# Patient Record
Sex: Female | Born: 1965 | Race: White | Hispanic: No | Marital: Married | Smoking: Never smoker
Health system: Southern US, Community
[De-identification: ages and names within clinical notes are randomized; demographics above are authoritative.]

## PROBLEM LIST (undated history)

## (undated) DIAGNOSIS — D649 Anemia, unspecified: Secondary | ICD-10-CM

## (undated) DIAGNOSIS — F988 Other specified behavioral and emotional disorders with onset usually occurring in childhood and adolescence: Secondary | ICD-10-CM

## (undated) HISTORY — DX: Anemia, unspecified: D64.9

## (undated) HISTORY — PX: EYE SURGERY: SHX253

## (undated) HISTORY — DX: Other specified behavioral and emotional disorders with onset usually occurring in childhood and adolescence: F98.8

## (undated) HISTORY — PX: WISDOM TOOTH EXTRACTION: SHX21

---

## 2000-09-10 ENCOUNTER — Encounter (INDEPENDENT_AMBULATORY_CARE_PROVIDER_SITE_OTHER): Payer: Self-pay

## 2000-09-10 ENCOUNTER — Inpatient Hospital Stay (HOSPITAL_COMMUNITY): Admission: AD | Admit: 2000-09-10 | Discharge: 2000-09-12 | Payer: Self-pay | Admitting: Obstetrics and Gynecology

## 2000-09-14 ENCOUNTER — Encounter: Admission: RE | Admit: 2000-09-14 | Discharge: 2000-12-13 | Payer: Self-pay | Admitting: Obstetrics and Gynecology

## 2010-01-19 ENCOUNTER — Emergency Department (HOSPITAL_COMMUNITY): Admission: EM | Admit: 2010-01-19 | Discharge: 2010-01-19 | Payer: Self-pay | Admitting: Emergency Medicine

## 2015-08-08 ENCOUNTER — Ambulatory Visit (INDEPENDENT_AMBULATORY_CARE_PROVIDER_SITE_OTHER): Payer: BLUE CROSS/BLUE SHIELD | Admitting: Sports Medicine

## 2015-08-08 ENCOUNTER — Encounter: Payer: Self-pay | Admitting: Sports Medicine

## 2015-08-08 ENCOUNTER — Ambulatory Visit
Admission: RE | Admit: 2015-08-08 | Discharge: 2015-08-08 | Disposition: A | Discharge status: Home / Self Care | Payer: BLUE CROSS/BLUE SHIELD | Source: Ambulatory Visit | Attending: Sports Medicine | Admitting: Sports Medicine

## 2015-08-08 VITALS — BP 127/79 | HR 76 | Ht 64.0 in | Wt 110.0 lb

## 2015-08-08 DIAGNOSIS — M542 Cervicalgia: Secondary | ICD-10-CM

## 2015-08-08 DIAGNOSIS — M4302 Spondylolysis, cervical region: Secondary | ICD-10-CM

## 2015-08-08 MED ORDER — CYCLOBENZAPRINE HCL 5 MG PO TABS: ORAL_TABLET | ORAL | Status: DC

## 2015-08-08 NOTE — Patient Instructions (Signed)
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

## 2015-08-08 NOTE — Assessment & Plan Note (Signed)
We will try some home exercises and use of Flexeril at night

## 2015-08-08 NOTE — Assessment & Plan Note (Signed)
I suspect she gets nerve impingement  This is probably triggering the chronic trapezius spasm  We will try Flexeril but if she does not get a response would give a trial of gabapentin  Posture and isometric exercises for the neck

## 2015-08-08 NOTE — Progress Notes (Signed)
Patient ID: Katie RangeMichelle W Chandler, female   DOB: 1966/08/05, 49 y.o.   MRN: [id number]  Tennis athlete who plays 8 - 10 hours weekly She has had right-sided trapezius and neck pain This has been persistent at least 4 months No specific injury She also lifts weights Most lifts do not cause pain Bending her neck to the right rotating the neck causes pain Hitting a two-handed back and causes pain  She has tried massage with no relief Advil gives minimal relief  Past history- denies serious medical illnesses and has had very few visits to the physician  Social history - Works in Print production plannerreal estate sales. She will get more pain in the neck with too much computer work  Review of systems No numbness or tingling in the hand/ no weakness of grip or in the forearm/ no cough or sneeze pain/ no go back pain  Physical exam Physically fit female in no acute distress BP 127/79 mmHg  Pulse 76  Ht 5\' 4"  (1.626 m)  Wt 110 lb (49.896 kg)  BMI 18.87 kg/m2  LMP 07/29/2015  For her back extension and flexion Rotation to the right is limited by 10 but is normal Lateral band causes pain on the right but is normal Spurling's test is normal bilaterally  Upper extremity strength is normal Neurologic testing C5-T1 is normal Reflexes are normal  X-ray of cervical spine Foraminal narrowing and some spurring mostly at C5-6 on the right and at C5-6 and C6-7 the left Disc spaces are largely preserved although C5-C6 is somewhat narrow

## 2016-04-21 DIAGNOSIS — Z1151 Encounter for screening for human papillomavirus (HPV): Secondary | ICD-10-CM | POA: Diagnosis not present

## 2016-04-21 DIAGNOSIS — Z681 Body mass index (BMI) 19 or less, adult: Secondary | ICD-10-CM | POA: Diagnosis not present

## 2016-04-21 DIAGNOSIS — Z01419 Encounter for gynecological examination (general) (routine) without abnormal findings: Secondary | ICD-10-CM | POA: Diagnosis not present

## 2016-06-29 DIAGNOSIS — Z0000 Encounter for general adult medical examination without abnormal findings: Secondary | ICD-10-CM | POA: Diagnosis not present

## 2016-06-29 DIAGNOSIS — N390 Urinary tract infection, site not specified: Secondary | ICD-10-CM | POA: Diagnosis not present

## 2016-06-29 DIAGNOSIS — R8299 Other abnormal findings in urine: Secondary | ICD-10-CM | POA: Diagnosis not present

## 2016-07-06 DIAGNOSIS — Z0000 Encounter for general adult medical examination without abnormal findings: Secondary | ICD-10-CM | POA: Diagnosis not present

## 2016-07-06 DIAGNOSIS — Z1389 Encounter for screening for other disorder: Secondary | ICD-10-CM | POA: Diagnosis not present

## 2016-07-06 DIAGNOSIS — Z681 Body mass index (BMI) 19 or less, adult: Secondary | ICD-10-CM | POA: Diagnosis not present

## 2016-07-06 DIAGNOSIS — F900 Attention-deficit hyperactivity disorder, predominantly inattentive type: Secondary | ICD-10-CM | POA: Diagnosis not present

## 2016-07-06 DIAGNOSIS — M25511 Pain in right shoulder: Secondary | ICD-10-CM | POA: Diagnosis not present

## 2016-08-31 ENCOUNTER — Other Ambulatory Visit: Payer: Self-pay | Admitting: *Deleted

## 2016-08-31 DIAGNOSIS — M542 Cervicalgia: Secondary | ICD-10-CM

## 2016-08-31 DIAGNOSIS — M4302 Spondylolysis, cervical region: Secondary | ICD-10-CM

## 2016-09-03 ENCOUNTER — Ambulatory Visit
Admission: RE | Admit: 2016-09-03 | Discharge: 2016-09-03 | Disposition: A | Discharge status: Home / Self Care | Payer: BLUE CROSS/BLUE SHIELD | Source: Ambulatory Visit | Attending: Sports Medicine | Admitting: Sports Medicine

## 2016-09-03 DIAGNOSIS — M47812 Spondylosis without myelopathy or radiculopathy, cervical region: Secondary | ICD-10-CM | POA: Diagnosis not present

## 2016-09-03 DIAGNOSIS — M542 Cervicalgia: Secondary | ICD-10-CM

## 2016-09-03 DIAGNOSIS — M4302 Spondylolysis, cervical region: Secondary | ICD-10-CM

## 2016-09-04 ENCOUNTER — Ambulatory Visit (INDEPENDENT_AMBULATORY_CARE_PROVIDER_SITE_OTHER): Payer: BLUE CROSS/BLUE SHIELD | Admitting: Sports Medicine

## 2016-09-04 ENCOUNTER — Ambulatory Visit: Payer: Self-pay

## 2016-09-04 ENCOUNTER — Encounter (INDEPENDENT_AMBULATORY_CARE_PROVIDER_SITE_OTHER): Payer: Self-pay

## 2016-09-04 DIAGNOSIS — M4302 Spondylolysis, cervical region: Secondary | ICD-10-CM

## 2016-09-04 DIAGNOSIS — M79661 Pain in right lower leg: Secondary | ICD-10-CM

## 2016-09-04 DIAGNOSIS — S86819A Strain of other muscle(s) and tendon(s) at lower leg level, unspecified leg, initial encounter: Secondary | ICD-10-CM | POA: Diagnosis not present

## 2016-09-04 MED ORDER — CYCLOBENZAPRINE HCL 10 MG PO TABS: 10.0000 mg | ORAL_TABLET | Freq: Every day | ORAL | 1 refills | Status: DC

## 2016-09-04 NOTE — Patient Instructions (Signed)
Icing  Calf exercises  H&T stretches  F/U in 2 wks

## 2016-09-08 DIAGNOSIS — S86819A Strain of other muscle(s) and tendon(s) at lower leg level, unspecified leg, initial encounter: Secondary | ICD-10-CM | POA: Insufficient documentation

## 2016-09-08 NOTE — Assessment & Plan Note (Signed)
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

## 2016-09-08 NOTE — Progress Notes (Signed)
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 smoker  ROS No radicular pain into RT arm No weakness in hand or arm  PEXAM Pleasant F/ NAD/ Walks with RT foot in plantar flexion  TTP at medial gastroc insertion to fascia No TTP lateral gastroc Soleus non tender Minimal local swelling No discoloration AT is normal Circulation good and neg Homans  Neck RT trapezius spasm Some limitation of lateral bending to RT Good flexion/ext w no pain Neg spurling  Data reviewed  Cervical spine films show DDD at C5/6 Foraminal narrowing at same level  Ultrasound of RT Calf There is mild edema at distal insertion of medial gastrocnemius to fascia This is evidenced by fiber disruption and some increase in hypoechoic change Doppler flow to this area is increased Lateral gastrocnemius is normal Soleus shows mild hypoechoic change only below area of medial gastrocnemius hypoechoic change Achilles tendon intact Deep vessels patent  Summary: Findings consistent with mild muscular strain to medial head of gastrocnemius (Tennis leg)  Ultrasound and interpretation by Enid BaasKarl Jvion Turgeon, MD

## 2016-09-08 NOTE — Assessment & Plan Note (Signed)
Start home exercise  First 2 weeks get motion and gait back to normal  Then push rehab  Recheck in 2 weeks as she wants to aggressively RT tennis  Heel lifts

## 2016-09-10 ENCOUNTER — Ambulatory Visit: Payer: BLUE CROSS/BLUE SHIELD | Admitting: Sports Medicine

## 2016-09-17 ENCOUNTER — Ambulatory Visit (INDEPENDENT_AMBULATORY_CARE_PROVIDER_SITE_OTHER): Payer: BLUE CROSS/BLUE SHIELD | Admitting: Sports Medicine

## 2016-09-17 ENCOUNTER — Ambulatory Visit: Payer: Self-pay

## 2016-09-17 ENCOUNTER — Encounter: Payer: Self-pay | Admitting: Sports Medicine

## 2016-09-17 VITALS — BP 129/73 | Ht 63.5 in | Wt 108.0 lb

## 2016-09-17 DIAGNOSIS — S86819A Strain of other muscle(s) and tendon(s) at lower leg level, unspecified leg, initial encounter: Secondary | ICD-10-CM

## 2016-09-17 DIAGNOSIS — M4302 Spondylolysis, cervical region: Secondary | ICD-10-CM | POA: Diagnosis not present

## 2016-09-17 NOTE — Assessment & Plan Note (Signed)
This much improved Use her heel lifts more consistently Calf compression Start back easy tennis drills She should be ready to play within 2 weeks Keep up home exercises

## 2016-09-17 NOTE — Progress Notes (Signed)
Chief complaint; followup of right medial gastrocnemius strain  Patient seen 2 weeks ago with a small right medial gastrocnemius strain She has been using calf compression She has been doing home exercises She can do 3 sets of 15 calf raises without significant pain She uses a heel lift in her shoes and this reduces the pain  She has been able do the elliptical, bike and exercises without pain She does get 2-3 level pain with too much walking  Past history degenerative disc in the cervical spine  Social history Nonsmoker Works as a Customer service managerreal estate agent  Review of systems No swelling in the calf or ankle No radicular pain from her neck She continues to have right trapezius spasm  Physical examination Thin female in no acute distress BP 129/73   Ht 5' 3.5" (1.613 m)   Wt 108 lb (49 kg)   BMI 18.83 kg/m   Palpation of the right calf reveals no significant tenderness No discoloration noted Good strength and ability to do heel lifts Walks without limp  Ultrasound of the right calf The area of muscular disruption noted prior is about 75% smaller There is minimal hypoechoic change No tear is seen There remains some increase in Doppler activity  Impression is a healing small tear of the medial gastrocnemius muscle  Ultrasound and interpretation by Royal HawthornKarl B. Darrick PennaFields, MD

## 2016-09-17 NOTE — Assessment & Plan Note (Signed)
Continue to work on an exercise program She can add slight resistance to her isometric exercises  We may consider adding nighttime gabapentin in the future if symptoms don't resolve

## 2016-11-11 DIAGNOSIS — H04009 Unspecified dacryoadenitis, unspecified lacrimal gland: Secondary | ICD-10-CM | POA: Diagnosis not present

## 2016-11-11 DIAGNOSIS — H04123 Dry eye syndrome of bilateral lacrimal glands: Secondary | ICD-10-CM | POA: Diagnosis not present

## 2016-11-28 DIAGNOSIS — H01009 Unspecified blepharitis unspecified eye, unspecified eyelid: Secondary | ICD-10-CM | POA: Diagnosis not present

## 2017-01-20 DIAGNOSIS — Z1231 Encounter for screening mammogram for malignant neoplasm of breast: Secondary | ICD-10-CM | POA: Diagnosis not present

## 2017-01-27 DIAGNOSIS — H16223 Keratoconjunctivitis sicca, not specified as Sjogren's, bilateral: Secondary | ICD-10-CM | POA: Diagnosis not present

## 2017-01-27 DIAGNOSIS — H04123 Dry eye syndrome of bilateral lacrimal glands: Secondary | ICD-10-CM | POA: Diagnosis not present

## 2017-01-27 DIAGNOSIS — H01009 Unspecified blepharitis unspecified eye, unspecified eyelid: Secondary | ICD-10-CM | POA: Diagnosis not present

## 2017-01-27 DIAGNOSIS — H04122 Dry eye syndrome of left lacrimal gland: Secondary | ICD-10-CM | POA: Diagnosis not present

## 2017-02-02 ENCOUNTER — Emergency Department (HOSPITAL_COMMUNITY)
Admission: EM | Admit: 2017-02-02 | Discharge: 2017-02-02 | Disposition: A | Discharge status: Home / Self Care | Payer: BLUE CROSS/BLUE SHIELD | Attending: Emergency Medicine | Admitting: Emergency Medicine

## 2017-02-02 ENCOUNTER — Encounter (HOSPITAL_COMMUNITY): Payer: Self-pay | Admitting: Emergency Medicine

## 2017-02-02 ENCOUNTER — Emergency Department (HOSPITAL_COMMUNITY): Payer: BLUE CROSS/BLUE SHIELD

## 2017-02-02 DIAGNOSIS — R000 Tachycardia, unspecified: Secondary | ICD-10-CM

## 2017-02-02 LAB — CBC
HCT: 40.3 % (ref 36.0–46.0)
Hemoglobin: 13.7 g/dL (ref 12.0–15.0)
MCH: 30 pg (ref 26.0–34.0)
MCHC: 34 g/dL (ref 30.0–36.0)
MCV: 88.2 fL (ref 78.0–100.0)
PLATELETS: 265 10*3/uL (ref 150–400)
RBC: 4.57 MIL/uL (ref 3.87–5.11)
RDW: 12.8 % (ref 11.5–15.5)
WBC: 6.7 10*3/uL (ref 4.0–10.5)

## 2017-02-02 LAB — BASIC METABOLIC PANEL
Anion gap: 8 (ref 5–15)
BUN: 15 mg/dL (ref 6–20)
CHLORIDE: 106 mmol/L (ref 101–111)
CO2: 23 mmol/L (ref 22–32)
CREATININE: 0.68 mg/dL (ref 0.44–1.00)
Calcium: 9.8 mg/dL (ref 8.9–10.3)
GFR calc non Af Amer: >60 mL/min (ref >60)
Glucose, Bld: 126 mg/dL — ABNORMAL HIGH (ref 65–99)
Potassium: 4.7 mmol/L (ref 3.5–5.1)
SODIUM: 137 mmol/L (ref 135–145)

## 2017-02-02 LAB — D-DIMER, QUANTITATIVE: D-Dimer, Quant: <0.27 ug/mL-FEU (ref 0.00–0.50)

## 2017-02-02 LAB — I-STAT TROPONIN, ED
TROPONIN I, POC: 0 ng/mL (ref 0.00–0.08)
TROPONIN I, POC: 0 ng/mL (ref 0.00–0.08)

## 2017-02-02 NOTE — ED Triage Notes (Signed)
Pt reports she was playing tennis when she noticed that her heart rate felt extremely high, pt looked at her watch and noticed hr in the 200's, denies chest pain or sob but endorses some dizziness that occurred when HR was elevated. Pt denies any previous cardiac hx. HR 95 currently. Pt a/ox4, resp e/u, nad.

## 2017-02-02 NOTE — Discharge Instructions (Signed)
Please read the attached information.  Please avoid any caffeine or stimulants, avoid intense exercise.  Please follow-up with cardiologist for repeat evaluation.  Please return immediately to the emergency room if any new or worsening signs or symptoms present.

## 2017-02-02 NOTE — ED Provider Notes (Signed)
[facility]-EMERGENCY DEPT Provider Note   CSN: [id number] Arrival date & time: 02/02/17  0708     History   Chief Complaint Chief Complaint  Patient presents with  . Tachycardia    HPI Katie Chandler is a 51 y.o. female.  HPI   51 year old female presents today with complaints of heart racing. Pt notes that she was playing tennis that she usually does.  She took a break from tennis, had a couple cups coffee and went back to hit balls.  She notes when she started doing this she felt her heart racing, she checked her heart rate monitor that showed she was in the 140s-150 range.  She notes this is abnormal as she was not putting forth very much effort.  At that time she noted she felt dizzy and lightheaded.  She denies any associated chest pain.  She was playing tennis with a medical doctor who attempted pushing on her eyeballs.  She notes she sat down on her car to change her shoes and saw that her heart rate was in the 200s, this did not worsen her clinical presentation.  Patient notes that this is never happened before, she denies any drug use, she reports daily activity without discomfort.  Patient reports that she takes methylphenidate and has for the last 5 years, notes she also uses emergency powder for electrolyte replacement.  Patient denies any risk factors for DVT or PE.  Previous cardiac history, non-smoker.  No hypertension, hyperlipidemia.   History reviewed. No pertinent past medical history.  Patient Active Problem List   Diagnosis Date Noted  . Strain of calf muscle, initial encounter 09/08/2016  . Neck pain on right side 08/08/2015  . 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 No pertinent surgical history.  OB History    No data available       Home Medications    Prior to Admission medications   Medication Sig Start Date End Date Taking? Authorizing Provider  ferrous sulfate 325 (65 FE) MG EC tablet Take 325 mg by mouth daily with breakfast.   Yes  Historical Provider, MD  methylphenidate (RITALIN LA) 30 MG 24 hr capsule Take 30 mg by mouth every morning.  09/04/16  Yes Historical Provider, MD  naproxen sodium (ANAPROX) 220 MG tablet Take 220 mg by mouth 2 (two) times daily as needed (pain).   Yes Historical Provider, MD  omega-3 acid ethyl esters (LOVAZA) 1 g capsule Take 1 g by mouth daily.   Yes Historical Provider, MD  RESTASIS 0.05 % ophthalmic emulsion Place 1 drop into both eyes daily. 11/19/16  Yes Historical Provider, MD  cyclobenzaprine (FLEXERIL) 10 MG tablet Take 1 tablet (10 mg total) by mouth at bedtime. Patient not taking: Reported on 02/02/2017 09/04/16   Enid Baas, MD  cyclobenzaprine (FLEXERIL) 5 MG tablet Take one-two at bedtime Patient not taking: Reported on 02/02/2017 08/08/15   Enid Baas, MD    Family History No family history on file.  Social History Social History  Substance Use Topics  . Smoking status: Never Smoker  . Smokeless tobacco: Not on file  . Alcohol use Not on file     Allergies   Patient has no known allergies.   Review of Systems Review of Systems  All other systems reviewed and are negative.   Physical Exam Updated Vital Signs BP 118/74   Pulse 78   Temp 97.6 F (36.4 C) (Oral)   Resp 19   LMP 01/19/2017   SpO2  100%   Physical Exam  Constitutional: She is oriented to person, place, and time. She appears well-developed and well-nourished.  HENT:  Head: Normocephalic and atraumatic.  Eyes: Conjunctivae are normal. Pupils are equal, round, and reactive to light. Right eye exhibits no discharge. Left eye exhibits no discharge. No scleral icterus.  Neck: Normal range of motion. No JVD present. No tracheal deviation present.  Cardiovascular: Normal rate, regular rhythm, normal heart sounds and intact distal pulses.  Exam reveals no friction rub.   No murmur heard. Pulmonary/Chest: Effort normal and breath sounds normal. No stridor. No respiratory distress. She has no wheezes. She  has no rales. She exhibits no tenderness.  Musculoskeletal: She exhibits no edema.  Neurological: She is alert and oriented to person, place, and time. Coordination normal.  Psychiatric: She has a normal mood and affect. Her behavior is normal. Judgment and thought content normal.  Nursing note and vitals reviewed.   ED Treatments / Results  Labs (all labs ordered are listed, but only abnormal results are displayed) Labs Reviewed  BASIC METABOLIC PANEL - Abnormal; Notable for the following:       Result Value   Glucose, Bld 126 (*)    All other components within normal limits  CBC  D-DIMER, QUANTITATIVE (NOT AT [facility])  I-STAT TROPOININ, ED  I-STAT TROPOININ, ED    EKG  EKG Interpretation  Date/Time:  Tuesday Feb 02 2017 07:15:21 EDT Ventricular Rate:  97 PR Interval:  142 QRS Duration: 78 QT Interval:  354 QTC Calculation: 449 R Axis:   95 Text Interpretation: Suspect arm lead reversal, interpretation assumes no reversal Normal sinus rhythm with sinus arrhythmia Rightward axis Borderline ECG No prior ECG for comparison.  No STEMI Confirmed by Rush Landmark MD, CHRISTOPHER [phone]) on 02/02/2017 8:32:35 AM       Radiology Dg Chest 2 View  Result Date: 02/02/2017 CLINICAL DATA:  Onset of tachycardia this morning. No cardiac history EXAM: CHEST  2 VIEW COMPARISON:  None in PACs FINDINGS: The lungs are mildly hyperinflated but clear. The heart and pulmonary vascularity are normal. The mediastinum is normal in width. There is no pleural effusion. The bony thorax is unremarkable. IMPRESSION: There is no acute cardiopulmonary abnormality. Electronically Signed   By: David  [place] M.D.   On: 02/02/2017 07:36    Procedures Procedures (including critical care time)  Medications Ordered in ED Medications - No data to display   Initial Impression / Assessment and Plan / ED Course  I have reviewed the triage vital signs and the nursing notes.  Pertinent labs & imaging results that were  available during my care of the patient were reviewed by me and considered in my medical decision making (see chart for details).     Final Clinical Impressions(s) / ED Diagnoses   Final diagnoses:  Tachycardia    Labs: I-STAT  Delta troponin, BMP, CBC, d-dimer  Imaging: DG Chest 2 View   Assessment/Plan: 24 YOF presents today with complaints of tachycardia. Pt slightly tachy while here with no associated complaints. She appears well in no acute distress. Based on her story concern for SVT vs PAF. D dimer negative here, no SOB, delta trop neg doubts ACS. Pt already has cardiology appointment scheduled.  She is instructed to avoid any intense physical activity, stimulants, and return immediately to the emergency room with any new or worsening signs or symptoms present.  Patient verbalized understanding and agreement to today's plan had no further questions or concerns at time discharge.  New Prescriptions Discharge Medication List as of 02/02/2017 11:34 AM       Eyvonne Mechanic, PA-C 02/02/17 1536    Canary Brim Tegeler, MD 02/03/17 [phone]

## 2017-02-04 ENCOUNTER — Ambulatory Visit (INDEPENDENT_AMBULATORY_CARE_PROVIDER_SITE_OTHER): Payer: BLUE CROSS/BLUE SHIELD | Admitting: Cardiovascular Disease

## 2017-02-04 ENCOUNTER — Encounter: Payer: Self-pay | Admitting: Cardiovascular Disease

## 2017-02-04 VITALS — BP 100/58 | HR 70 | Ht 63.5 in | Wt 114.0 lb

## 2017-02-04 DIAGNOSIS — R002 Palpitations: Secondary | ICD-10-CM

## 2017-02-04 LAB — T4, FREE: Free T4: 0.9 ng/dL (ref 0.8–1.8)

## 2017-02-04 LAB — TSH: TSH: 1.49 mIU/L

## 2017-02-04 NOTE — Patient Instructions (Signed)
Medication Instructions: Your physician recommends that you continue on your current medications as directed. Please refer to the Current Medication list given to you today.  Labwork: Your physician recommends that you return for lab work: TSH; Free, T4   Testing/Procedures: Your physician has recommended that you wear a 30 day event monitor. Event monitors are medical devices that record the heart's electrical activity. Doctors most often us these monitors to diagnose arrhythmias. Arrhythmias are problems with the speed or rhythm of the heartbeat. The monitor is a small, portable device. You can wear one while you do your normal daily activities. This is usually used to diagnose what is causing palpitations/syncope (passing out).  Follow-Up: Your physician recommends that you schedule a follow-up appointment as needed with Dr. Allyson SabalBerry.

## 2017-02-04 NOTE — Progress Notes (Signed)
02/04/2017 KENNY STERN   [birth year]  [id number]  Primary Physician Garlan Fillers, MD Primary Cardiologist: Runell Gess MD Roseanne Reno  HPI:  Katie Chandler is a delightful 51 year old thin and fit-appearing married Caucasian female mother of 2 children who works as a Customer service manager. She is friends with Dr. Charlies Constable as well as Dr. Charlott Rakes. She has no prior cardiac history. She has no cardiac risk factors. She is on Ritalin. She has never had a heart attack or stroke. There is no family history. She does drink 2 cups of coffee a day as well as sweet tea throughout the day. She plays tennis regularly at 6:00 in the morning. She wears a heart rate monitor. While playing tennis on Tuesday morning she noticed a bandlike sensation around her lower chest. Heart rate monitoring at 150-220. She went to the emergency room where an EKG showed sinus rhythm at 97 without changes and her lab work was unremarkable. She's had no recurrent symptoms. The episode sounds like PSVT. I've advised her to discontinue caffeine intake as well as Ritalin. We will place a 30 day event monitor on her.   Current Outpatient Prescriptions  Medication Sig Dispense Refill  . methylphenidate (RITALIN LA) 30 MG 24 hr capsule Take 30 mg by mouth every morning.   0  . RESTASIS 0.05 % ophthalmic emulsion Place 1 drop into both eyes daily.  2   No current facility-administered medications for this visit.     No Known Allergies  Social History   Social History  . Marital status: Married    Spouse name: N/A  . Number of children: N/A  . Years of education: N/A   Occupational History  . Not on file.   Social History Main Topics  . Smoking status: Never Smoker  . Smokeless tobacco: Never Used  . Alcohol use Not on file  . Drug use: No  . Sexual activity: Not on file   Other Topics Concern  . Not on file   Social History Narrative  . No narrative on file     Review of  Systems: General: negative for chills, fever, night sweats or weight changes.  Cardiovascular: negative for chest pain, dyspnea on exertion, edema, orthopnea, palpitations, paroxysmal nocturnal dyspnea or shortness of breath Dermatological: negative for rash Respiratory: negative for cough or wheezing Urologic: negative for hematuria Abdominal: negative for nausea, vomiting, diarrhea, bright red blood per rectum, melena, or hematemesis Neurologic: negative for visual changes, syncope, or dizziness All other systems reviewed and are otherwise negative except as noted above.    Blood pressure (!) 100/58, pulse 70, height 5' 3.5" (1.613 m), weight 114 lb (51.7 kg), last menstrual period 01/19/2017.  General appearance: alert and no distress Neck: no adenopathy, no carotid bruit, no JVD, supple, symmetrical, trachea midline and thyroid not enlarged, symmetric, no tenderness/mass/nodules Lungs: clear to auscultation bilaterally Heart: regular rate and rhythm, S1, S2 normal, no murmur, click, rub or gallop Extremities: extremities normal, atraumatic, no cyanosis or edema  EKG not performed today  ASSESSMENT AND PLAN:   Palpitations Claude is self-referred for dilation of tachycardia palpitations that occurred 2 days ago while playing tennis. She is a healthy 51 year old thin and fit-appearing Caucasian female with no prior cardiovascular history. She has no risk factors. She's never had a heart attack or stroke. She spirits. Syncope. She does drink 2 cups of coffee a day as well as sweet tea throughout the day and is on  Ritalin as well. Playing tennis Tuesday morning she noticed a bandlike sensation around her lower chest. She does wear heart rate monitoring of his heart rate was in the 150 at 200 range. She felt somewhat dizzy but denied chest pain or shortness of breath. She went to the emergency room where her rhythm was sinus and the EKG showed sinus rhythm at 97. Her blood work was negative.  She's had no recurrent symptoms. I suspect she had an episode of PSVT aggravated by excessive exercise along with stimulants such as coffee and caffeine. I recommended that she limit caffeine from her diet. Will obtain a 30 day event monitor and I will see her back as needed unless there is an abnormality on the monitor the needs addressing and/or she has recurrent symptoms.      Runell GessJonathan J. Kingston Shawgo MD FACP,FACC,FAHA, [facility] 02/04/2017 1:51 PM

## 2017-02-04 NOTE — Assessment & Plan Note (Signed)
Katie Chandler is self-referred for dilation of tachycardia palpitations that occurred 2 days ago while playing tennis. She is a healthy 51 year old thin and fit-appearing Caucasian female with no prior cardiovascular history. She has no risk factors. She's never had a heart attack or stroke. She spirits. Syncope. She does drink 2 cups of coffee a day as well as sweet tea throughout the day and is on Ritalin as well. Playing tennis Tuesday morning she noticed a bandlike sensation around her lower chest. She does wear heart rate monitoring of his heart rate was in the 150 at 200 range. She felt somewhat dizzy but denied chest pain or shortness of breath. She went to the emergency room where her rhythm was sinus and the EKG showed sinus rhythm at 97. Her blood work was negative. She's had no recurrent symptoms. I suspect she had an episode of PSVT aggravated by excessive exercise along with stimulants such as coffee and caffeine. I recommended that she limit caffeine from her diet. Will obtain a 30 day event monitor and I will see her back as needed unless there is an abnormality on the monitor the needs addressing and/or she has recurrent symptoms.

## 2017-02-09 ENCOUNTER — Ambulatory Visit (INDEPENDENT_AMBULATORY_CARE_PROVIDER_SITE_OTHER): Payer: BLUE CROSS/BLUE SHIELD

## 2017-02-09 DIAGNOSIS — R002 Palpitations: Secondary | ICD-10-CM | POA: Diagnosis not present

## 2017-02-11 ENCOUNTER — Ambulatory Visit: Payer: BLUE CROSS/BLUE SHIELD | Admitting: Cardiology

## 2017-02-22 ENCOUNTER — Telehealth: Payer: Self-pay | Admitting: Cardiovascular Disease

## 2017-02-22 NOTE — Telephone Encounter (Signed)
Received serious cardiac monitor report. On 02/21/17 @ 10:23 CT auto-trigger report for HR of 174.5 bpm was recorded. Patient was exercising/playing tennis/outside during this episode per report. This is the first episode recorded for duration of monitor thus far. Reviewed by DOD, Dr. Duke Salviaandolph. No med changes were recommended. Placed monitor in MD mail box for review.

## 2017-02-26 ENCOUNTER — Telehealth: Payer: Self-pay | Admitting: *Deleted

## 2017-02-26 NOTE — Telephone Encounter (Signed)
Serious notification from [facility], 02/25/17 @ 6:24 am, SVT rate 178 bpm. Left message for pt to call

## 2017-03-04 ENCOUNTER — Telehealth: Payer: Self-pay | Admitting: Cardiology

## 2017-03-04 NOTE — Telephone Encounter (Signed)
Spoke with pt, she feels absolutely fine and did not realize she even had an elevated heart rate. She will call if issues arise.

## 2017-03-04 NOTE — Telephone Encounter (Signed)
Left message for patient to call with any issues.

## 2017-03-04 NOTE — Telephone Encounter (Signed)
Patient returning your call, thanks. °

## 2017-03-17 DIAGNOSIS — H16223 Keratoconjunctivitis sicca, not specified as Sjogren's, bilateral: Secondary | ICD-10-CM | POA: Diagnosis not present

## 2017-03-17 DIAGNOSIS — H01009 Unspecified blepharitis unspecified eye, unspecified eyelid: Secondary | ICD-10-CM | POA: Diagnosis not present

## 2017-03-17 DIAGNOSIS — H04123 Dry eye syndrome of bilateral lacrimal glands: Secondary | ICD-10-CM | POA: Diagnosis not present

## 2017-04-21 DIAGNOSIS — H01009 Unspecified blepharitis unspecified eye, unspecified eyelid: Secondary | ICD-10-CM | POA: Diagnosis not present

## 2017-04-21 DIAGNOSIS — H16223 Keratoconjunctivitis sicca, not specified as Sjogren's, bilateral: Secondary | ICD-10-CM | POA: Diagnosis not present

## 2017-04-21 DIAGNOSIS — H04123 Dry eye syndrome of bilateral lacrimal glands: Secondary | ICD-10-CM | POA: Diagnosis not present

## 2017-04-28 DIAGNOSIS — Z1151 Encounter for screening for human papillomavirus (HPV): Secondary | ICD-10-CM | POA: Diagnosis not present

## 2017-04-28 DIAGNOSIS — Z01419 Encounter for gynecological examination (general) (routine) without abnormal findings: Secondary | ICD-10-CM | POA: Diagnosis not present

## 2017-04-28 DIAGNOSIS — Z681 Body mass index (BMI) 19 or less, adult: Secondary | ICD-10-CM | POA: Diagnosis not present

## 2017-07-08 DIAGNOSIS — Z0000 Encounter for general adult medical examination without abnormal findings: Secondary | ICD-10-CM | POA: Diagnosis not present

## 2017-07-26 DIAGNOSIS — R229 Localized swelling, mass and lump, unspecified: Secondary | ICD-10-CM | POA: Diagnosis not present

## 2017-07-26 DIAGNOSIS — D649 Anemia, unspecified: Secondary | ICD-10-CM | POA: Diagnosis not present

## 2017-07-26 DIAGNOSIS — Z0000 Encounter for general adult medical examination without abnormal findings: Secondary | ICD-10-CM | POA: Diagnosis not present

## 2017-07-26 DIAGNOSIS — Z1389 Encounter for screening for other disorder: Secondary | ICD-10-CM | POA: Diagnosis not present

## 2017-07-26 DIAGNOSIS — F900 Attention-deficit hyperactivity disorder, predominantly inattentive type: Secondary | ICD-10-CM | POA: Diagnosis not present

## 2017-07-27 DIAGNOSIS — Z23 Encounter for immunization: Secondary | ICD-10-CM | POA: Diagnosis not present

## 2017-08-02 DIAGNOSIS — D2271 Melanocytic nevi of right lower limb, including hip: Secondary | ICD-10-CM | POA: Diagnosis not present

## 2017-08-02 DIAGNOSIS — Z1212 Encounter for screening for malignant neoplasm of rectum: Secondary | ICD-10-CM | POA: Diagnosis not present

## 2017-08-02 DIAGNOSIS — D225 Melanocytic nevi of trunk: Secondary | ICD-10-CM | POA: Diagnosis not present

## 2017-08-02 DIAGNOSIS — D171 Benign lipomatous neoplasm of skin and subcutaneous tissue of trunk: Secondary | ICD-10-CM | POA: Diagnosis not present

## 2017-10-20 DIAGNOSIS — M79672 Pain in left foot: Secondary | ICD-10-CM | POA: Diagnosis not present

## 2017-10-20 DIAGNOSIS — M205X2 Other deformities of toe(s) (acquired), left foot: Secondary | ICD-10-CM | POA: Diagnosis not present

## 2017-10-20 DIAGNOSIS — M79671 Pain in right foot: Secondary | ICD-10-CM | POA: Diagnosis not present

## 2017-10-20 DIAGNOSIS — M898X7 Other specified disorders of bone, ankle and foot: Secondary | ICD-10-CM | POA: Diagnosis not present

## 2017-12-20 ENCOUNTER — Ambulatory Visit (INDEPENDENT_AMBULATORY_CARE_PROVIDER_SITE_OTHER): Payer: BLUE CROSS/BLUE SHIELD | Admitting: Sports Medicine

## 2017-12-20 ENCOUNTER — Encounter: Payer: Self-pay | Admitting: Sports Medicine

## 2017-12-20 VITALS — BP 110/74 | Ht 63.5 in | Wt 106.0 lb

## 2017-12-20 DIAGNOSIS — M25562 Pain in left knee: Secondary | ICD-10-CM | POA: Diagnosis not present

## 2017-12-20 NOTE — Progress Notes (Signed)
   Subjective:    Patient ID: Katie Chandler, female    DOB: [birth year], 52 y.o.   MRN: [id number]  HPI chief complaint: Left knee pain  52 year old female comes in today after having injured her left knee while skiing on March 14. While skiing backwards, she felt a "twinge" in her anterior knee followed by a pop as her knee hyperextended. She admits that the pop may have actually been her ski and not her knee. She was concerned about a possible anterior cruciate ligament tear and was seen at a local emergency room.X-rays were obtained. She was told that she may in fact have an anterior cruciate ligament tear and was instructed to follow-up with orthopedics or sports medicine [place] in [place]. She has been wearing a body helix compression sleeve and since the time of her injury her pain has improved dramatically. What pain she is experiencing is in the anterior lateral knee. She denies any swelling currently but thinks that she may have had some swelling initially. No feelings of instability. No locking or catching. No significant knee problems in the past.  Past medical history reviewed Medications reviewed Allergies reviewed    Review of Systems As above    Objective:   Physical Exam  Well-developed, well-nourished. No acute distress. Awake alert and oriented 3. Vital signs reviewed  Left knee: Full range of motion. No effusion. No soft tissue swelling. Knee is stable to valgus and varus stressing. Negative anterior drawer, negative posterior drawer. Negative Lachman and negative lever test. No tenderness to palpation along medial or lateral joint lines. Negative McMurray's. Negative Thessaly's. She is slightly tender to palpation in the area of Hoffa's fat pad in the anterior lateral knee. Extensor mechanism is intact. Neurovascularly intact distally. Walking without a limp.  X-rays of the left knee include AP and lateral views. X-rays are dated 12/16/2017. They are unremarkable. No  joint effusion. No fracture. No significant degenerative changes.      Assessment & Plan:   Rapidly improving left knee pain status post hyperextension injury  Exam is very reassuring. No need for further imaging or treatment. She may resume activity as tolerated. She will continue with her body helix compression sleeve and we have educated her in a single isometric quad exercise to do for the next couple of weeks. Follow-up for ongoing or recalcitrant issues.

## 2018-01-26 DIAGNOSIS — Z1231 Encounter for screening mammogram for malignant neoplasm of breast: Secondary | ICD-10-CM | POA: Diagnosis not present

## 2018-06-15 DIAGNOSIS — H04123 Dry eye syndrome of bilateral lacrimal glands: Secondary | ICD-10-CM | POA: Diagnosis not present

## 2018-08-05 DIAGNOSIS — R5383 Other fatigue: Secondary | ICD-10-CM | POA: Diagnosis not present

## 2018-08-05 DIAGNOSIS — Z0000 Encounter for general adult medical examination without abnormal findings: Secondary | ICD-10-CM | POA: Diagnosis not present

## 2018-08-12 DIAGNOSIS — Z0000 Encounter for general adult medical examination without abnormal findings: Secondary | ICD-10-CM | POA: Diagnosis not present

## 2018-08-12 DIAGNOSIS — R002 Palpitations: Secondary | ICD-10-CM | POA: Diagnosis not present

## 2018-08-12 DIAGNOSIS — Z1389 Encounter for screening for other disorder: Secondary | ICD-10-CM | POA: Diagnosis not present

## 2018-08-12 DIAGNOSIS — F900 Attention-deficit hyperactivity disorder, predominantly inattentive type: Secondary | ICD-10-CM | POA: Diagnosis not present

## 2018-08-12 DIAGNOSIS — Z23 Encounter for immunization: Secondary | ICD-10-CM | POA: Diagnosis not present

## 2018-08-16 ENCOUNTER — Encounter: Payer: Self-pay | Admitting: Gastroenterology

## 2018-08-19 DIAGNOSIS — Z1212 Encounter for screening for malignant neoplasm of rectum: Secondary | ICD-10-CM | POA: Diagnosis not present

## 2018-09-09 ENCOUNTER — Encounter: Payer: Self-pay | Admitting: Gastroenterology

## 2018-09-09 ENCOUNTER — Ambulatory Visit (AMBULATORY_SURGERY_CENTER): Payer: Self-pay | Admitting: *Deleted

## 2018-09-09 VITALS — Ht 63.5 in | Wt 112.0 lb

## 2018-09-09 DIAGNOSIS — Z1211 Encounter for screening for malignant neoplasm of colon: Secondary | ICD-10-CM

## 2018-09-09 NOTE — Progress Notes (Signed)
No egg or soy allergy known to patient  No issues with past sedation with any surgeries  or procedures, no intubation problems  No diet pills per patient No home 02 use per patient  No blood thinners per patient  Pt denies issues with constipation  No A fib or A flutter  EMMI video sent to pt's e mail  Pt was instructed to stop her IRON 5 days before the procedure starting Saturdays 12-14

## 2018-09-19 DIAGNOSIS — Z1151 Encounter for screening for human papillomavirus (HPV): Secondary | ICD-10-CM | POA: Diagnosis not present

## 2018-09-19 DIAGNOSIS — Z681 Body mass index (BMI) 19 or less, adult: Secondary | ICD-10-CM | POA: Diagnosis not present

## 2018-09-19 DIAGNOSIS — Z01419 Encounter for gynecological examination (general) (routine) without abnormal findings: Secondary | ICD-10-CM | POA: Diagnosis not present

## 2018-09-22 ENCOUNTER — Encounter: Payer: Self-pay | Admitting: Gastroenterology

## 2018-09-22 ENCOUNTER — Ambulatory Visit (AMBULATORY_SURGERY_CENTER): Payer: BLUE CROSS/BLUE SHIELD | Admitting: Gastroenterology

## 2018-09-22 VITALS — BP 106/76 | HR 58 | Temp 97.5°F | Resp 13 | Ht 63.5 in | Wt 112.0 lb

## 2018-09-22 DIAGNOSIS — Z1211 Encounter for screening for malignant neoplasm of colon: Secondary | ICD-10-CM

## 2018-09-22 MED ORDER — SODIUM CHLORIDE 0.9 % IV SOLN: 500.0000 mL | Freq: Once | INTRAVENOUS | Status: AC

## 2018-09-22 NOTE — Progress Notes (Signed)
PT taken to PACU. Monitors in place. VSS. Report given to RN. 

## 2018-09-22 NOTE — Op Note (Signed)
[facility] Patient Name: Katie EarthlyMichelle Chandler Procedure Date: 09/22/2018 11:10 AM MRN: [id number] Endoscopist: Tressia DanasKimberly Kacee Koren MD, MD Age: 52 Referring MD:  Date of Birth: May 08, 1966 Gender: Female Account #: [id number] Procedure:                Colonoscopy Indications:              Screening for colorectal malignant neoplasm, This                            is the patient's first colonoscopy. No known family                            history of colon cancer or polyps. No baseline GI                            symptoms. Medicines:                See the Anesthesia note for documentation of the                            administered medications Procedure:                Pre-Anesthesia Assessment:                           - Prior to the procedure, a History and Physical                            was performed, and patient medications and                            allergies were reviewed. The patient's tolerance of                            previous anesthesia was also reviewed. The risks                            and benefits of the procedure and the sedation                            options and risks were discussed with the patient.                            All questions were answered, and informed consent                            was obtained. Prior Anticoagulants: The patient has                            taken no previous anticoagulant or antiplatelet                            agents. ASA Grade Assessment: I - A normal, healthy  patient. After reviewing the risks and benefits,                            the patient was deemed in satisfactory condition to                            undergo the procedure.                           After obtaining informed consent, the colonoscope                            was passed under direct vision. Throughout the                            procedure, the patient's blood pressure, pulse, and                        oxygen saturations were monitored continuously. The                            Model CF-HQ190L [id number]) scope was introduced                            through the anus and advanced to the the terminal                            ileum, with identification of the appendiceal                            orifice and IC valve. The patient tolerated the                            procedure well. The colonoscopy was performed with                            moderate difficulty due to a redundant colon,                            significant looping and a tortuous colon.                            Successful completion of the procedure was aided by                            applying abdominal pressure. Scope In: 11:16:09 AM Scope Out: 11:33:34 AM Scope Withdrawal Time: 0 hours 9 minutes 19 seconds  Total Procedure Duration: 0 hours 17 minutes 25 seconds  Findings:                 The perianal and digital rectal examinations were                            normal.  The colon (entire examined portion) appeared                            normal. The colon is tortuous and redundant. Complications:            No immediate complications. Estimated Blood Loss:     Estimated blood loss: none. Impression:               - The entire examined colon is normal.                           - No specimens collected. Recommendation:           - Discharge patient to home.                           - Resume regular diet.                           - Continue present medications.                           - Repeat colonoscopy in 10 years for screening                            purposes, earlier with any new symptoms. Tressia Danas MD, MD 09/22/2018 11:37:50 AM This report has been signed electronically.

## 2018-09-22 NOTE — Patient Instructions (Signed)
YOU HAD AN ENDOSCOPIC PROCEDURE TODAY AT THE [facility]:   Refer to the procedure report that was given to you for any specific questions about what was found during the examination.  If the procedure report does not answer your questions, please call your gastroenterologist to clarify.  If you requested that your care partner not be given the details of your procedure findings, then the procedure report has been included in a sealed envelope for you to review at your convenience later.  YOU SHOULD EXPECT: Some feelings of bloating in the abdomen. Passage of more gas than usual.  Walking can help get rid of the air that was put into your GI tract during the procedure and reduce the bloating. If you had a lower endoscopy (such as a colonoscopy or flexible sigmoidoscopy) you may notice spotting of blood in your stool or on the toilet paper. If you underwent a bowel prep for your procedure, you may not have a normal bowel movement for a few days.  Please Note:  You might notice some irritation and congestion in your nose or some drainage.  This is from the oxygen used during your procedure.  There is no need for concern and it should clear up in a day or so.  SYMPTOMS TO REPORT IMMEDIATELY:   Following lower endoscopy (colonoscopy or flexible sigmoidoscopy):  Excessive amounts of blood in the stool  Significant tenderness or worsening of abdominal pains  Swelling of the abdomen that is new, acute  Fever of 100F or higher  For urgent or emergent issues, a gastroenterologist can be reached at any hour by calling [phone].   DIET:  We do recommend a small meal at first, but then you may proceed to your regular diet.  Drink plenty of fluids but you should avoid alcoholic beverages for 24 hours.  MEDICATIONS:  Continue present medications.  ACTIVITY:  You should plan to take it easy for the rest of today and you should NOT DRIVE or use heavy machinery until tomorrow (because of the  sedation medicines used during the test).    FOLLOW UP: Our staff will call the number listed on your records the next business day following your procedure to check on you and address any questions or concerns that you may have regarding the information given to you following your procedure. If we do not reach you, we will leave a message.  However, if you are feeling well and you are not experiencing any problems, there is no need to return our call.  We will assume that you have returned to your regular daily activities without incident.  If any biopsies were taken you will be contacted by phone or by letter within the next 1-3 weeks.  Please call us at [phone] if you have not heard about the biopsies in 3 weeks.   Thank you for allowing us to provide for your healthcare needs today.   SIGNATURES/CONFIDENTIALITY: You and/or your care partner have signed paperwork which will be entered into your electronic medical record.  These signatures attest to the fact that that the information above on your After Visit Summary has been reviewed and is understood.  Full responsibility of the confidentiality of this discharge information lies with you and/or your care-partner. 

## 2018-09-23 ENCOUNTER — Telehealth: Payer: Self-pay

## 2018-09-23 NOTE — Telephone Encounter (Signed)
Attempted to reach pt. With follow- up call following endoscopic procedure 09/22/2018.  LM on pt. Voice mail to call if she has any questions or concerns. 

## 2018-09-23 NOTE — Telephone Encounter (Signed)
Left message on answering machine. 

## 2019-09-08 ENCOUNTER — Other Ambulatory Visit: Payer: Self-pay

## 2019-09-08 DIAGNOSIS — Z20822 Contact with and (suspected) exposure to covid-19: Secondary | ICD-10-CM

## 2019-09-10 LAB — NOVEL CORONAVIRUS, NAA: SARS-CoV-2, NAA: DETECTED — AB

## 2019-09-11 ENCOUNTER — Ambulatory Visit: Payer: BLUE CROSS/BLUE SHIELD | Admitting: Family Medicine

## 2019-09-15 ENCOUNTER — Ambulatory Visit (INDEPENDENT_AMBULATORY_CARE_PROVIDER_SITE_OTHER): Payer: 59 | Admitting: Orthopaedic Surgery

## 2019-09-15 ENCOUNTER — Other Ambulatory Visit: Payer: Self-pay | Admitting: Orthopaedic Surgery

## 2019-09-15 ENCOUNTER — Ambulatory Visit: Payer: 59 | Admitting: Orthopaedic Surgery

## 2019-09-15 ENCOUNTER — Other Ambulatory Visit: Payer: Self-pay

## 2019-09-15 DIAGNOSIS — M25561 Pain in right knee: Secondary | ICD-10-CM | POA: Insufficient documentation

## 2019-09-15 MED ORDER — MELOXICAM 7.5 MG PO TABS: 15.0000 mg | ORAL_TABLET | Freq: Every day | ORAL | 2 refills | Status: DC

## 2019-09-15 NOTE — Progress Notes (Signed)
Office Visit Note   Patient: Katie Chandler           Date of Birth: January 29, 1966           MRN: [id number] Visit Date: 09/15/2019              Requested by: Leanna Battles, MD [address] [place],  [place] [address] PCP: Leanna Battles, MD   Assessment & Plan: Visit Diagnoses:  1. Acute pain of right knee     Plan: Impression is right MCL sprain with possible small medial meniscal tear.  She is not having effusion or any mechanical symptoms.  Based on our discussion we will hold off on any injections for now.  She will stay on a tennis or any strenuous activity for another couple weeks.  In the meantime she will use meloxicam for 2 weeks, Voltaren gel, knee compression sleeve.  She is allowed to do light activity as tolerated.  She will let me know if this does not get better.  Follow-Up Instructions: No follow-ups on file.   Orders:  No orders of the defined types were placed in this encounter.  No orders of the defined types were placed in this encounter.     Procedures: No procedures performed   Clinical Data: No additional findings.   Subjective: No chief complaint on file.   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  She had an injury to her right knee while she was skiing a couple weeks ago over Thanksgiving break in which she fell backwards while she was still buckled to her ski and so she hyperflexed her knee and may have felt to the side.  She felt immediate pain in both of her knees but her right knee has felt much worse since.  [place]  She did not obtain any x-rays or ultrasound at that time.  She denies any swelling to her knees.  She states that when she got back in town she did try to play tennis and she had an episode in which she moved laterally and felt immediate sharp pain in her knee that caused her to fall.  She does not have  any symptoms when she moves in a straight line.  Denies any mechanical symptoms.  Denies any numbness and tingling.  She does endorse some discomfort in the back of her thigh and knee with terminal flexion.   Review of Systems   Objective: Vital Signs: There were no vitals taken for this visit.  Physical Exam  Ortho Exam Right knee exam shows no joint effusion.  Collaterals and cruciates are stable.  Normal range of motion.  Negative McMurray.  She has mild tenderness along the MCL and medial joint line.  She endorses some discomfort and fullness in the back of the thigh and knee with terminal flexion.  Dial test is normal and symmetric.  Negative patellar apprehension.  Patella mobility is symmetric to 1 quadrant.  Negative J sign. Specialty Comments:  No specialty comments available.  Imaging: No results found.   PMFS History: Patient Active Problem List   Diagnosis Date Noted  . Acute pain of right knee 09/15/2019  . Palpitations 02/04/2017  . Strain of calf muscle, initial encounter 09/08/2016  . Neck pain on right side 08/08/2015  . 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  ADD (attention deficit disorder)   . Anemia     Family History  Problem Relation Age of Onset  . Esophageal cancer Paternal Grandfather   . Colon cancer Neg Hx   . Colon polyps Neg Hx   . Rectal cancer Neg Hx   . Stomach cancer Neg Hx     Past Surgical History:  Procedure Laterality Date  . EYE SURGERY     lasik  . EYE SURGERY     tear ducts opened   . WISDOM TOOTH EXTRACTION     Social History   Occupational History  . Not on file  Tobacco Use  . Smoking status: Never Smoker  . Smokeless tobacco: Never Used  Substance and Sexual Activity  . Alcohol use: Yes    Alcohol/week: 0.0 standard drinks    Comment: red wine nightly  . Drug use: No  . Sexual activity: Not on file

## 2019-09-25 ENCOUNTER — Ambulatory Visit: Payer: 59 | Admitting: Family Medicine

## 2019-10-03 ENCOUNTER — Other Ambulatory Visit: Payer: Self-pay

## 2019-10-03 ENCOUNTER — Ambulatory Visit: Payer: Self-pay

## 2019-10-03 ENCOUNTER — Ambulatory Visit (INDEPENDENT_AMBULATORY_CARE_PROVIDER_SITE_OTHER): Payer: 59 | Admitting: Sports Medicine

## 2019-10-03 ENCOUNTER — Ambulatory Visit (INDEPENDENT_AMBULATORY_CARE_PROVIDER_SITE_OTHER): Payer: 59 | Admitting: Orthopaedic Surgery

## 2019-10-03 VITALS — BP 112/78 | Ht 63.5 in | Wt 106.0 lb

## 2019-10-03 DIAGNOSIS — S83261A Peripheral tear of lateral meniscus, current injury, right knee, initial encounter: Secondary | ICD-10-CM | POA: Diagnosis not present

## 2019-10-03 DIAGNOSIS — M25561 Pain in right knee: Secondary | ICD-10-CM

## 2019-10-03 NOTE — Progress Notes (Signed)
I spoke with Katie Chandler on the phone today.  She was playing tennis yesterday when she planted on and twisted her right leg and knee and felt immediate severe pain that caused her to fall.  She has been unable to ambulate without pain.  She has mild pain at rest.  She can barely range her knee.  Dr. Oneida Alar examined her at his office and performed an ultrasound of her knee and found a lateral meniscus tear with possible loose body.  In light of these findings, we will order an MRI of the right knee to evaluate for bucket handle tear of LM.

## 2019-10-03 NOTE — Assessment & Plan Note (Signed)
Based on knee effusion, persistent sxs of giving way and Ultrasound findings, I think she has a loose fragment from a lateral meniscal peripheral tear  Advised to moderate activity Keep up some exercises for fitness  Refer to Dr. Erlinda Hong for evaluation and possible arthroscopy

## 2019-10-03 NOTE — Progress Notes (Addendum)
CC: RT knee pain  Patient had a ski injury a little more than a month ago She fell backwards and RT foot rotated outward Extreme pain when trying to get back into the ski Knee felt swollen and less stable  Evaluated and no clear ACL injury or fracture  Came home and since then knee periodically gives out The knee also does not flex as well Pain along lateral aspect of the RT knee  Rested a few weeks Tried tennis again 72 hours ago and knee gave out on a 2 hand back hand No pain on forehand or slice backhand Pain and wants to give out if she tries a 2 hand BH  ROS Similar injury to left knee 12/2017 That resolved after a couple of weeks No locking Seems slightly swollen in mornings or after activity Can walk, run in straight line or do exercises with no real pain  PE Pleasant, thin, athletic F in NAD BP 112/78   Ht 5' 3.5" (1.613 m)   Wt 106 lb (48.1 kg)   BMI 18.48 kg/m   Knee: Normal to inspection with no erythema or effusion or obvious bony abnormalities. Palpation normal with no warmth or joint line tenderness or patellar tenderness or condyle tenderness. ROM normal in flexion and extension and lower leg rotation. Ligaments with solid consistent endpoints including ACL, PCL, LCL, MCL. Negative Mcmurray's and provocative meniscal tests. Non painful patellar compression. Patellar and quadriceps tendons unremarkable. Hamstring and quadriceps strength is normal.  Note flexion is 10 deg less on RT Some pain on full flexion  Ultrasound of Right Knee  Moderate effusion in suprapatellar pouch Quadriceps and patellar tendons normal Medial meniscus normal Lateral meniscus - there is a free fragment located at joint line noted in 3 separate views Posterior knee is unremarkable with normal gastrocnemius and hamstring tendons  Impression: loose body likely from lateral meniscus tear  Ultrasound and interpretation by Wolfgang Phoenix. Kynslee Baham, MD   MRI  Reviewed and discussed  with patient. Surprised to find complete proximal tear of ACL Medial meniscus tear is consistent with her symptoms However, I think there is some lateral swelling on MRI and possibly a fragment of soft tissue as we noted on [place].  That is where she is tender and feel pain so likely is part of the injury complex. I advised that she will need surgery to repair ACL and the surgeon will assess what needs to be done with meniscus.  Ila Mcgill, MD

## 2019-10-04 ENCOUNTER — Ambulatory Visit: Payer: 59 | Admitting: Physician Assistant

## 2019-10-04 ENCOUNTER — Other Ambulatory Visit: Payer: Self-pay | Admitting: Orthopaedic Surgery

## 2019-10-04 ENCOUNTER — Ambulatory Visit
Admission: RE | Admit: 2019-10-04 | Discharge: 2019-10-04 | Disposition: A | Discharge status: Home / Self Care | Payer: 59 | Source: Ambulatory Visit | Attending: Orthopaedic Surgery | Admitting: Orthopaedic Surgery

## 2019-10-04 DIAGNOSIS — M25561 Pain in right knee: Secondary | ICD-10-CM

## 2019-12-29 ENCOUNTER — Ambulatory Visit: Payer: 59 | Admitting: Cardiovascular Disease

## 2020-05-05 IMAGING — MR MR KNEE*R* W/O CM
6 series · 37 of 40 positions shown · non-contrast
Comparison: None.

CLINICAL DATA: Persistent lateral knee pain since fall while skiing
in [place]], acutely worsened after twisting injury playing tennis 2
days ago.

EXAM:
MRI OF THE RIGHT KNEE WITHOUT CONTRAST
TECHNIQUE: Multiplanar, multisequence MR imaging of the knee was performed. No
intravenous contrast was administered.

[Series 6: T2 fat-sat · axial · right · 4.0mm · 0.50mm/px · z∈[-54,+56]mm · 7 of 24 slices shown (1 of 3)]
[im 1/24]
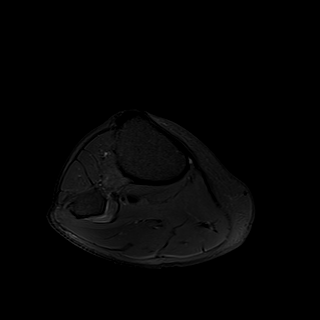
[im 4/24]
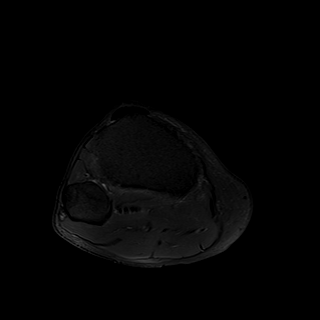
[im 8/24]
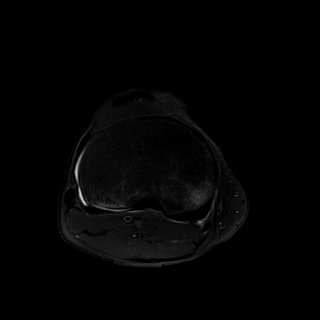
[im 12/24]
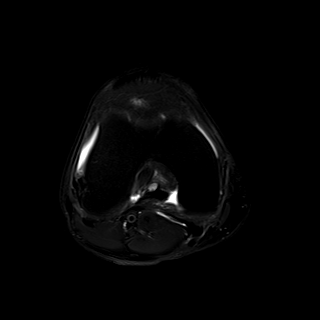
[im 16/24]
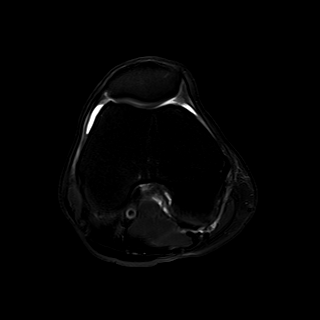
[im 20/24]
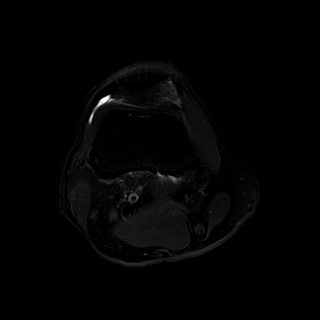
[im 24/24]
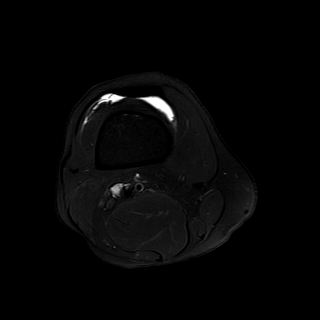

[Series 7: T2 fat-sat · coronal · right · 4.0mm · 0.39mm/px · 6 of 22 slices shown (2 of 3)]
[im 1/22]
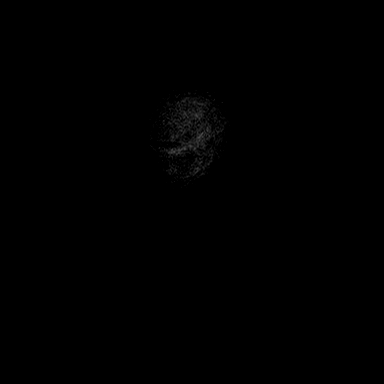
[im 5/22]
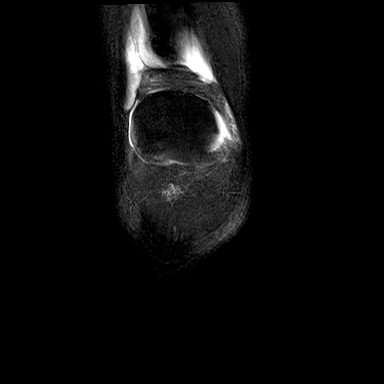
[im 9/22]
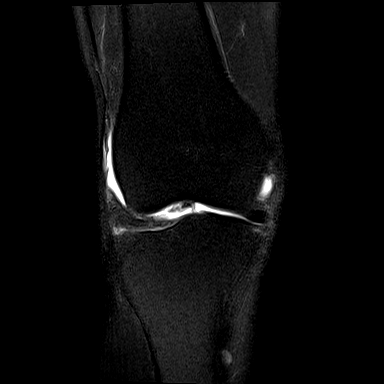
[im 13/22]
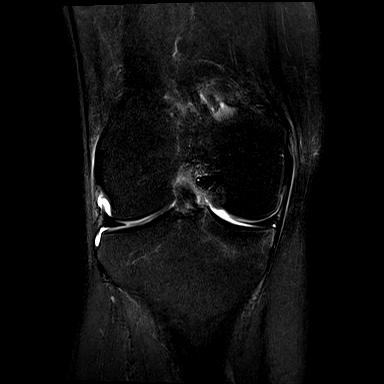
[im 17/22]
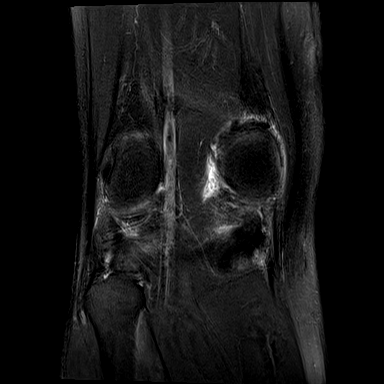
[im 22/22]
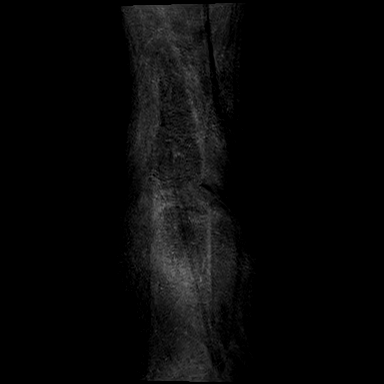

[Series 8: T1 · coronal · right · 4.0mm · 0.39mm/px · 3 of 22 slices shown]
[im 1/22]
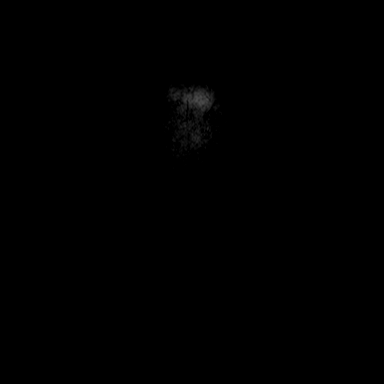
[im 5/22]
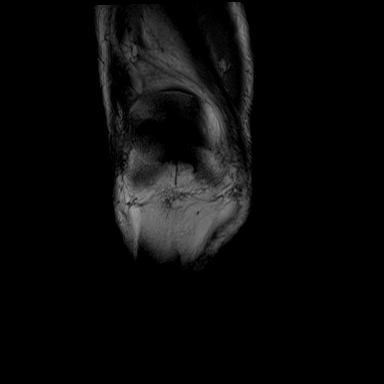
[im 9/22]
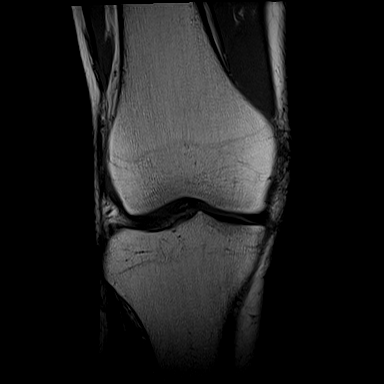

[Series 9: PD fat-sat · coronal · right · 3.0mm · 0.47mm/px · 7 of 26 slices shown (1 of 2)]
[im 1/26]
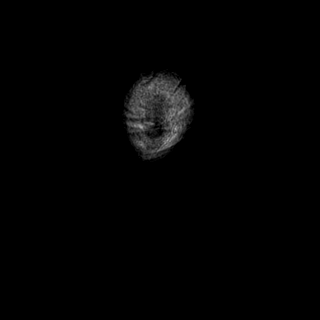
[im 5/26]
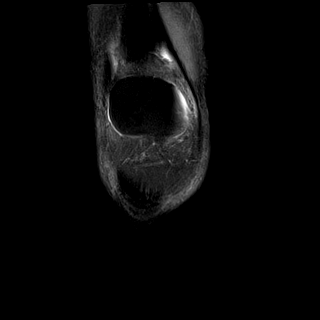
[im 9/26]
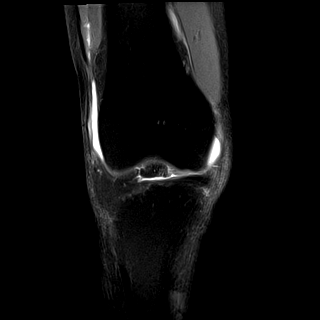
[im 13/26]
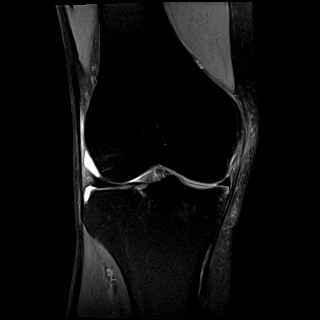
[im 17/26]
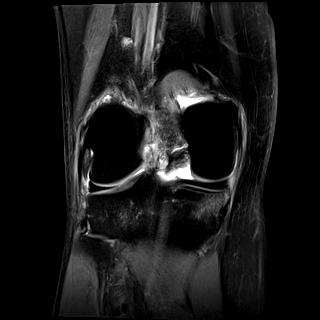
[im 21/26]
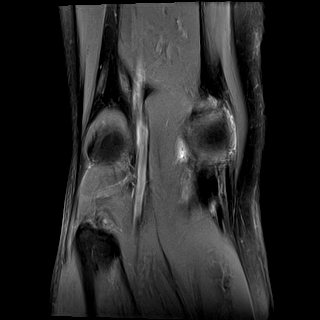
[im 26/26]
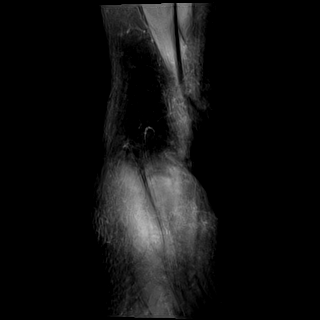

[Series 10: PD fat-sat · sagittal · right · 3.3mm · 0.39mm/px · 7 of 24 slices shown (2 of 2)]
[im 1/24]
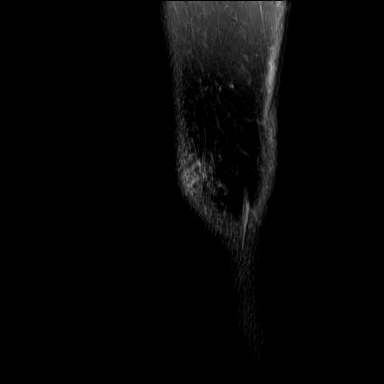
[im 4/24]
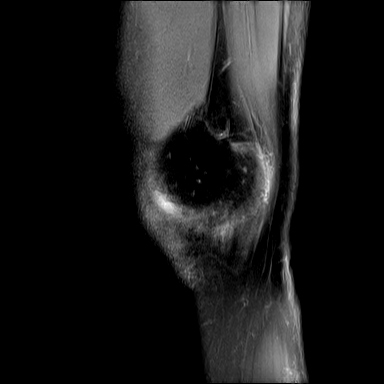
[im 8/24]
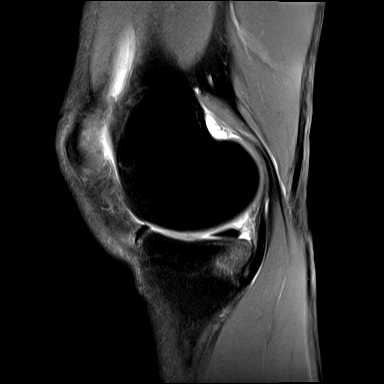
[im 12/24]
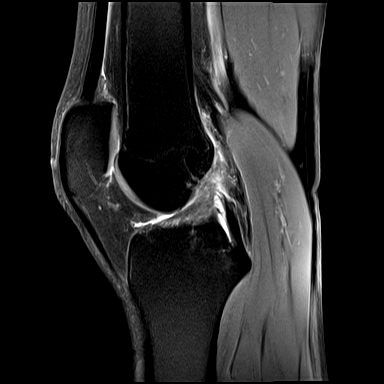
[im 16/24]
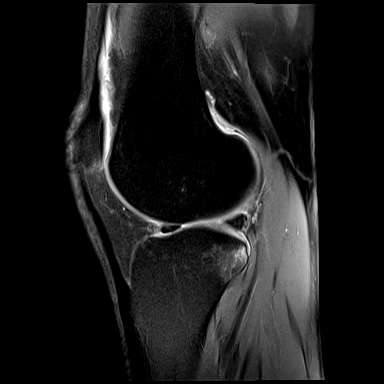
[im 20/24]
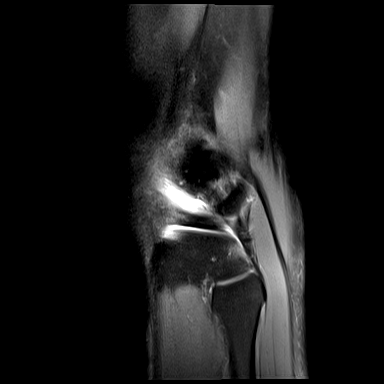
[im 24/24]
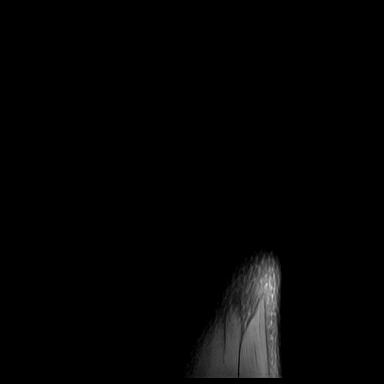

[Series 11: T2 fat-sat · sagittal · right · 3.3mm · 0.39mm/px · 7 of 24 slices shown (3 of 3)]
[im 1/24]
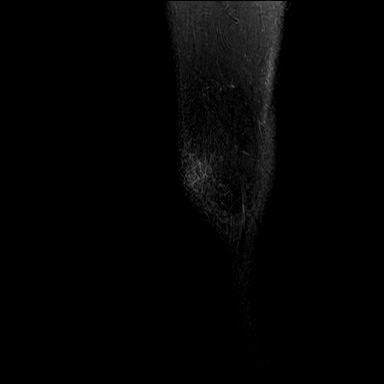
[im 4/24]
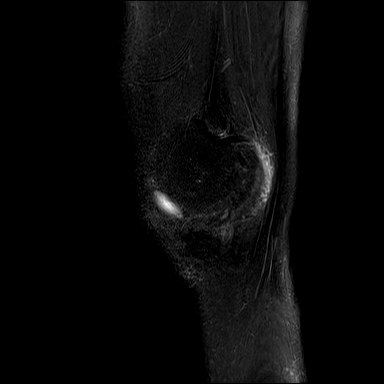
[im 8/24]
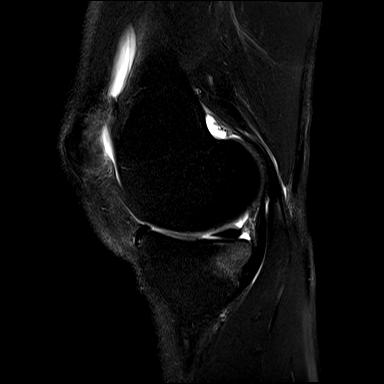
[im 12/24]
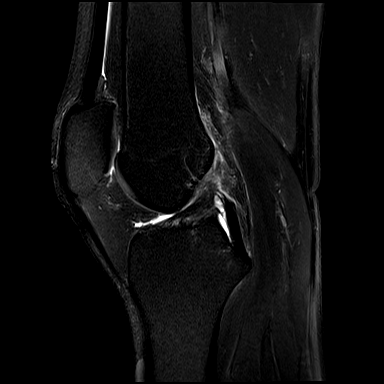
[im 16/24]
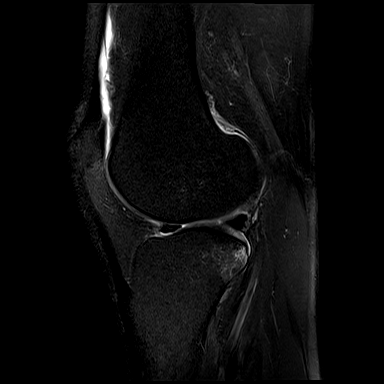
[im 20/24]
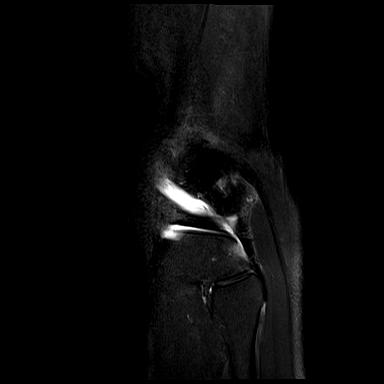
[im 24/24]
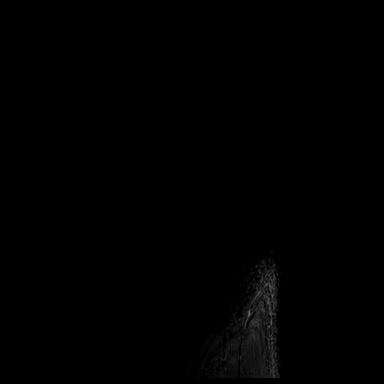

[37 of 40 positions shown; findings below may reference images not displayed]

FINDINGS: MENISCI

Medial meniscus: Peripheral longitudinal tear of the mid body with
tiny parameniscal cyst (series 9, image 12).

Lateral meniscus:  Intact.

LIGAMENTS

Cruciates:  Complete tear of the proximal ACL.  The PCL is intact.

Collaterals: Medial collateral ligament is intact. Mild edema
adjacent to the MCL. Lateral collateral ligament complex is intact.

CARTILAGE

Patellofemoral:  No chondral defect.

Medial:  No chondral defect.

Lateral:  No chondral defect.

Joint: Small joint effusion. Minimal edema within the central aspect
of Hoffa's fat. No plical thickening.

Popliteal Fossa: No significant Baker cyst. Intact popliteus tendon.

Extensor Mechanism: Intact quadriceps tendon and patellar tendon.
Intact medial and lateral patellar retinaculum. Intact MPFL.

Bones: Small contusions in the posterior medial and lateral tibial
plateaus. No acute fracture or dislocation. No suspicious bone
lesion.

Other: None.
IMPRESSION: 1. Complete tear of the proximal ACL.
2. Peripheral longitudinal tear of the medial meniscus mid body with
tiny parameniscal cyst.
3. Grade 1 MCL sprain.
4. Small contusions in the posterior medial and lateral tibial
plateaus. No fracture.

## 2020-06-17 DIAGNOSIS — Z9889 Other specified postprocedural states: Secondary | ICD-10-CM | POA: Diagnosis not present

## 2020-09-02 DIAGNOSIS — Z0000 Encounter for general adult medical examination without abnormal findings: Secondary | ICD-10-CM | POA: Diagnosis not present

## 2020-09-02 DIAGNOSIS — R7989 Other specified abnormal findings of blood chemistry: Secondary | ICD-10-CM | POA: Diagnosis not present

## 2020-09-03 DIAGNOSIS — R82998 Other abnormal findings in urine: Secondary | ICD-10-CM | POA: Diagnosis not present

## 2020-09-25 DIAGNOSIS — Z0000 Encounter for general adult medical examination without abnormal findings: Secondary | ICD-10-CM | POA: Diagnosis not present

## 2020-11-13 DIAGNOSIS — R002 Palpitations: Secondary | ICD-10-CM | POA: Diagnosis not present

## 2020-11-13 DIAGNOSIS — R9389 Abnormal findings on diagnostic imaging of other specified body structures: Secondary | ICD-10-CM | POA: Diagnosis not present

## 2020-11-14 ENCOUNTER — Other Ambulatory Visit: Payer: Self-pay | Admitting: Adult Health

## 2020-11-14 ENCOUNTER — Other Ambulatory Visit (HOSPITAL_COMMUNITY): Payer: Self-pay | Admitting: Internal Medicine

## 2020-11-14 DIAGNOSIS — E079 Disorder of thyroid, unspecified: Secondary | ICD-10-CM

## 2020-11-14 DIAGNOSIS — R9389 Abnormal findings on diagnostic imaging of other specified body structures: Secondary | ICD-10-CM

## 2020-11-15 ENCOUNTER — Other Ambulatory Visit: Payer: Self-pay

## 2020-11-15 ENCOUNTER — Encounter (HOSPITAL_COMMUNITY): Payer: Self-pay

## 2020-11-15 ENCOUNTER — Ambulatory Visit
Admission: RE | Admit: 2020-11-15 | Discharge: 2020-11-15 | Disposition: A | Discharge status: Home / Self Care | Payer: BC Managed Care – PPO | Source: Ambulatory Visit | Attending: Adult Health | Admitting: Adult Health

## 2020-11-15 ENCOUNTER — Ambulatory Visit (HOSPITAL_COMMUNITY)
Admission: RE | Admit: 2020-11-15 | Discharge: 2020-11-15 | Disposition: A | Discharge status: Home / Self Care | Payer: BC Managed Care – PPO | Source: Ambulatory Visit | Attending: Internal Medicine | Admitting: Internal Medicine

## 2020-11-15 DIAGNOSIS — R9389 Abnormal findings on diagnostic imaging of other specified body structures: Secondary | ICD-10-CM

## 2020-11-15 DIAGNOSIS — E041 Nontoxic single thyroid nodule: Secondary | ICD-10-CM | POA: Diagnosis not present

## 2020-11-18 ENCOUNTER — Encounter (HOSPITAL_COMMUNITY): Payer: 59

## 2020-11-18 ENCOUNTER — Encounter (HOSPITAL_COMMUNITY): Payer: Self-pay

## 2020-12-26 DIAGNOSIS — Z01419 Encounter for gynecological examination (general) (routine) without abnormal findings: Secondary | ICD-10-CM | POA: Diagnosis not present

## 2020-12-26 DIAGNOSIS — Z308 Encounter for other contraceptive management: Secondary | ICD-10-CM | POA: Diagnosis not present

## 2021-01-14 DIAGNOSIS — R87612 Low grade squamous intraepithelial lesion on cytologic smear of cervix (LGSIL): Secondary | ICD-10-CM | POA: Diagnosis not present

## 2021-01-15 DIAGNOSIS — H16223 Keratoconjunctivitis sicca, not specified as Sjogren's, bilateral: Secondary | ICD-10-CM | POA: Diagnosis not present

## 2021-01-15 DIAGNOSIS — H04123 Dry eye syndrome of bilateral lacrimal glands: Secondary | ICD-10-CM | POA: Diagnosis not present

## 2021-01-15 DIAGNOSIS — H01005 Unspecified blepharitis left lower eyelid: Secondary | ICD-10-CM | POA: Diagnosis not present

## 2021-01-15 DIAGNOSIS — H01002 Unspecified blepharitis right lower eyelid: Secondary | ICD-10-CM | POA: Diagnosis not present

## 2021-05-12 DIAGNOSIS — Z1231 Encounter for screening mammogram for malignant neoplasm of breast: Secondary | ICD-10-CM | POA: Diagnosis not present

## 2021-11-25 DIAGNOSIS — Z79899 Other long term (current) drug therapy: Secondary | ICD-10-CM | POA: Diagnosis not present

## 2021-11-25 DIAGNOSIS — R9389 Abnormal findings on diagnostic imaging of other specified body structures: Secondary | ICD-10-CM | POA: Diagnosis not present

## 2021-11-25 DIAGNOSIS — R634 Abnormal weight loss: Secondary | ICD-10-CM | POA: Diagnosis not present

## 2022-01-01 DIAGNOSIS — Z01419 Encounter for gynecological examination (general) (routine) without abnormal findings: Secondary | ICD-10-CM | POA: Diagnosis not present

## 2022-01-01 DIAGNOSIS — Z9889 Other specified postprocedural states: Secondary | ICD-10-CM | POA: Diagnosis not present

## 2022-01-01 DIAGNOSIS — R87612 Low grade squamous intraepithelial lesion on cytologic smear of cervix (LGSIL): Secondary | ICD-10-CM | POA: Diagnosis not present

## 2022-01-01 DIAGNOSIS — N870 Mild cervical dysplasia: Secondary | ICD-10-CM | POA: Diagnosis not present

## 2022-02-06 DIAGNOSIS — S83512A Sprain of anterior cruciate ligament of left knee, initial encounter: Secondary | ICD-10-CM | POA: Diagnosis not present

## 2022-02-06 DIAGNOSIS — I341 Nonrheumatic mitral (valve) prolapse: Secondary | ICD-10-CM | POA: Diagnosis not present

## 2022-02-16 DIAGNOSIS — Z13820 Encounter for screening for osteoporosis: Secondary | ICD-10-CM | POA: Diagnosis not present

## 2022-02-16 DIAGNOSIS — M810 Age-related osteoporosis without current pathological fracture: Secondary | ICD-10-CM | POA: Diagnosis not present

## 2022-03-25 DIAGNOSIS — H04123 Dry eye syndrome of bilateral lacrimal glands: Secondary | ICD-10-CM | POA: Diagnosis not present

## 2022-03-25 DIAGNOSIS — H16223 Keratoconjunctivitis sicca, not specified as Sjogren's, bilateral: Secondary | ICD-10-CM | POA: Diagnosis not present

## 2022-03-25 DIAGNOSIS — H2513 Age-related nuclear cataract, bilateral: Secondary | ICD-10-CM | POA: Diagnosis not present

## 2022-03-26 DIAGNOSIS — M25552 Pain in left hip: Secondary | ICD-10-CM | POA: Diagnosis not present

## 2022-03-26 DIAGNOSIS — M25551 Pain in right hip: Secondary | ICD-10-CM | POA: Diagnosis not present

## 2022-03-27 DIAGNOSIS — M25559 Pain in unspecified hip: Secondary | ICD-10-CM | POA: Diagnosis not present

## 2022-03-30 DIAGNOSIS — S76311D Strain of muscle, fascia and tendon of the posterior muscle group at thigh level, right thigh, subsequent encounter: Secondary | ICD-10-CM | POA: Diagnosis not present

## 2022-03-30 DIAGNOSIS — M7062 Trochanteric bursitis, left hip: Secondary | ICD-10-CM | POA: Diagnosis not present

## 2022-05-04 DIAGNOSIS — D2272 Melanocytic nevi of left lower limb, including hip: Secondary | ICD-10-CM | POA: Diagnosis not present

## 2022-05-04 DIAGNOSIS — D2261 Melanocytic nevi of right upper limb, including shoulder: Secondary | ICD-10-CM | POA: Diagnosis not present

## 2022-05-04 DIAGNOSIS — I788 Other diseases of capillaries: Secondary | ICD-10-CM | POA: Diagnosis not present

## 2022-05-04 DIAGNOSIS — D2221 Melanocytic nevi of right ear and external auricular canal: Secondary | ICD-10-CM | POA: Diagnosis not present

## 2022-05-12 DIAGNOSIS — I341 Nonrheumatic mitral (valve) prolapse: Secondary | ICD-10-CM | POA: Diagnosis not present

## 2022-05-12 DIAGNOSIS — S76311A Strain of muscle, fascia and tendon of the posterior muscle group at thigh level, right thigh, initial encounter: Secondary | ICD-10-CM | POA: Diagnosis not present

## 2022-05-12 DIAGNOSIS — M810 Age-related osteoporosis without current pathological fracture: Secondary | ICD-10-CM | POA: Diagnosis not present

## 2022-05-21 DIAGNOSIS — Z1231 Encounter for screening mammogram for malignant neoplasm of breast: Secondary | ICD-10-CM | POA: Diagnosis not present

## 2022-06-10 DIAGNOSIS — F900 Attention-deficit hyperactivity disorder, predominantly inattentive type: Secondary | ICD-10-CM | POA: Diagnosis not present

## 2022-06-10 DIAGNOSIS — M419 Scoliosis, unspecified: Secondary | ICD-10-CM | POA: Diagnosis not present

## 2022-06-10 DIAGNOSIS — Z0000 Encounter for general adult medical examination without abnormal findings: Secondary | ICD-10-CM | POA: Diagnosis not present

## 2022-07-27 DIAGNOSIS — H02882 Meibomian gland dysfunction right lower eyelid: Secondary | ICD-10-CM | POA: Diagnosis not present

## 2022-07-27 DIAGNOSIS — H04123 Dry eye syndrome of bilateral lacrimal glands: Secondary | ICD-10-CM | POA: Diagnosis not present

## 2022-07-27 DIAGNOSIS — H02885 Meibomian gland dysfunction left lower eyelid: Secondary | ICD-10-CM | POA: Diagnosis not present

## 2022-08-17 DIAGNOSIS — H04123 Dry eye syndrome of bilateral lacrimal glands: Secondary | ICD-10-CM | POA: Diagnosis not present

## 2022-08-17 DIAGNOSIS — F909 Attention-deficit hyperactivity disorder, unspecified type: Secondary | ICD-10-CM | POA: Diagnosis not present

## 2022-08-17 DIAGNOSIS — Z23 Encounter for immunization: Secondary | ICD-10-CM | POA: Diagnosis not present

## 2022-08-17 DIAGNOSIS — Z131 Encounter for screening for diabetes mellitus: Secondary | ICD-10-CM | POA: Diagnosis not present

## 2022-08-17 DIAGNOSIS — Z13220 Encounter for screening for lipoid disorders: Secondary | ICD-10-CM | POA: Diagnosis not present

## 2022-08-17 DIAGNOSIS — Z0000 Encounter for general adult medical examination without abnormal findings: Secondary | ICD-10-CM | POA: Diagnosis not present
